# Patient Record
Sex: Female | Born: 1955 | Race: White | Hispanic: No | Marital: Married
Health system: Midwestern US, Community
[De-identification: ages and names within clinical notes are randomized; demographics above are authoritative.]

## PROBLEM LIST (undated history)

## (undated) DIAGNOSIS — S46011A Strain of muscle(s) and tendon(s) of the rotator cuff of right shoulder, initial encounter: Secondary | ICD-10-CM

---

## 2016-10-28 ENCOUNTER — Emergency Department: Admit: 2016-10-28 | Payer: MEDICARE | Primary: Internal Medicine

## 2016-10-28 ENCOUNTER — Inpatient Hospital Stay
Admit: 2016-10-28 | Discharge: 2016-10-28 | Disposition: A | Discharge status: Home / Self Care | Payer: MEDICARE | Attending: Emergency Medicine

## 2016-10-28 DIAGNOSIS — M25552 Pain in left hip: Secondary | ICD-10-CM

## 2016-10-28 LAB — METABOLIC PANEL, COMPREHENSIVE
A-G Ratio: 1 — ABNORMAL LOW (ref 1.1–2.2)
ALT (SGPT): 48 U/L (ref 12–78)
AST (SGOT): 32 U/L (ref 15–37)
Albumin: 4.3 g/dL (ref 3.5–5.0)
Alk. phosphatase: 48 U/L (ref 45–117)
Anion gap: 8 mmol/L (ref 5–15)
BUN/Creatinine ratio: 18 (ref 12–20)
BUN: 19 MG/DL (ref 6–20)
Bilirubin, total: 0.5 MG/DL (ref 0.2–1.0)
CO2: 27 mmol/L (ref 21–32)
Calcium: 9.9 MG/DL (ref 8.5–10.1)
Chloride: 103 mmol/L (ref 97–108)
Creatinine: 1.06 MG/DL — ABNORMAL HIGH (ref 0.55–1.02)
GFR est AA: >60 mL/min/{1.73_m2} (ref >60)
GFR est non-AA: 53 mL/min/{1.73_m2} — ABNORMAL LOW (ref >60)
Globulin: 4.1 g/dL — ABNORMAL HIGH (ref 2.0–4.0)
Glucose: 105 mg/dL — ABNORMAL HIGH (ref 65–100)
Potassium: 3.7 mmol/L (ref 3.5–5.1)
Protein, total: 8.4 g/dL — ABNORMAL HIGH (ref 6.4–8.2)
Sodium: 138 mmol/L (ref 136–145)

## 2016-10-28 LAB — CBC WITH AUTOMATED DIFF
ABS. BASOPHILS: 0 10*3/uL (ref 0.0–0.1)
ABS. EOSINOPHILS: 0 10*3/uL (ref 0.0–0.4)
ABS. IMM. GRANS.: 0.1 10*3/uL — ABNORMAL HIGH (ref 0.00–0.04)
ABS. LYMPHOCYTES: 1.3 10*3/uL (ref 0.8–3.5)
ABS. MONOCYTES: 0.3 10*3/uL (ref 0.0–1.0)
ABS. NEUTROPHILS: 6.6 10*3/uL (ref 1.8–8.0)
ABSOLUTE NRBC: 0 10*3/uL (ref 0.00–0.01)
BASOPHILS: 0 % (ref 0–1)
EOSINOPHILS: 0 % (ref 0–7)
HCT: 39.6 % (ref 35.0–47.0)
HGB: 13.6 g/dL (ref 11.5–16.0)
IMMATURE GRANULOCYTES: 1 % — ABNORMAL HIGH (ref 0.0–0.5)
LYMPHOCYTES: 16 % (ref 12–49)
MCH: 38.2 PG — ABNORMAL HIGH (ref 26.0–34.0)
MCHC: 34.3 g/dL (ref 30.0–36.5)
MCV: 111.2 FL — ABNORMAL HIGH (ref 80.0–99.0)
MONOCYTES: 4 % — ABNORMAL LOW (ref 5–13)
MPV: 9.1 FL (ref 8.9–12.9)
NEUTROPHILS: 79 % — ABNORMAL HIGH (ref 32–75)
NRBC: 0 PER 100 WBC
PLATELET: 304 10*3/uL (ref 150–400)
RBC: 3.56 M/uL — ABNORMAL LOW (ref 3.80–5.20)
RDW: 12.9 % (ref 11.5–14.5)
WBC: 8.3 10*3/uL (ref 3.6–11.0)

## 2016-10-28 LAB — URINALYSIS W/ REFLEX CULTURE
Bacteria: NEGATIVE /hpf
Bilirubin: NEGATIVE
Glucose: NEGATIVE mg/dL
Ketone: NEGATIVE mg/dL
Leukocyte Esterase: NEGATIVE
Nitrites: NEGATIVE
Protein: NEGATIVE mg/dL
Specific gravity: 1.008 (ref 1.003–1.030)
Urobilinogen: 0.2 EU/dL (ref 0.2–1.0)
pH (UA): 7.5 (ref 5.0–8.0)

## 2016-10-28 LAB — PTT: aPTT: 30.7 s (ref 22.1–32.0)

## 2016-10-28 LAB — PROTHROMBIN TIME + INR
INR: 1 (ref 0.9–1.1)
Prothrombin time: 10.1 s (ref 9.0–11.1)

## 2016-10-28 MED ORDER — SODIUM CHLORIDE 0.9 % IV
INTRAVENOUS | Status: DC
Start: 2016-10-28 — End: 2016-10-28
  Administered 2016-10-28: 19:00:00 via INTRAVENOUS

## 2016-10-28 MED ORDER — DIAZEPAM 5 MG TAB
5 mg | ORAL | Status: AC
Start: 2016-10-28 — End: 2016-10-28
  Administered 2016-10-28: 23:00:00 via ORAL

## 2016-10-28 MED ORDER — FENTANYL CITRATE (PF) 50 MCG/ML IJ SOLN
50 mcg/mL | INTRAMUSCULAR | Status: AC
Start: 2016-10-28 — End: 2016-10-28
  Administered 2016-10-28: 19:00:00 via INTRAVENOUS

## 2016-10-28 MED ORDER — OXYCODONE 5 MG TAB
5 mg | ORAL | Status: AC
Start: 2016-10-28 — End: 2016-10-28
  Administered 2016-10-28: 21:00:00 via ORAL

## 2016-10-28 MED ORDER — DIAZEPAM 5 MG TAB
5 mg | ORAL_TABLET | Freq: Three times a day (TID) | ORAL | 0 refills | Status: AC | PRN
Start: 2016-10-28 — End: ?

## 2016-10-28 MED ORDER — METHYLPREDNISOLONE (PF) 125 MG/2 ML IJ SOLR
125 mg/2 mL | INTRAMUSCULAR | Status: AC
Start: 2016-10-28 — End: 2016-10-28
  Administered 2016-10-28: 23:00:00 via INTRAVENOUS

## 2016-10-28 MED ORDER — PREDNISONE 20 MG TAB
20 mg | ORAL_TABLET | Freq: Every day | ORAL | 0 refills | Status: AC
Start: 2016-10-28 — End: 2016-11-02

## 2016-10-28 MED FILL — DIAZEPAM 5 MG TAB: 5 mg | ORAL | Qty: 2

## 2016-10-28 MED FILL — FENTANYL CITRATE (PF) 50 MCG/ML IJ SOLN: 50 mcg/mL | INTRAMUSCULAR | Qty: 2

## 2016-10-28 MED FILL — SOLU-MEDROL (PF) 125 MG/2 ML SOLUTION FOR INJECTION: 125 mg/2 mL | INTRAMUSCULAR | Qty: 2

## 2016-10-28 MED FILL — OXYCODONE 5 MG TAB: 5 mg | ORAL | Qty: 2

## 2016-10-28 NOTE — ED Notes (Signed)
Dr. Kizzie Banehughes reviewed discharge instructions with the patient and spouse.  The patient and spouse verbalized understanding. Pt discharged home via wheelchair, discharge papers in hand.

## 2016-10-28 NOTE — ED Notes (Signed)
Pt to xray via stretcher.

## 2016-10-28 NOTE — ED Provider Notes (Signed)
EMERGENCY DEPARTMENT HISTORY AND PHYSICAL EXAM      Date: 10/28/2016  Patient Name: Sharon Camacho    History of Presenting Illness     Chief Complaint   Patient presents with   ??? Hip Pain     left hip pain s/p fall     History Provided By: Patient    HPI: Sharon Camacho, 61 y.o. female with PMHx significant for rheumatoid arthritis, multiple sclerosis, HTN, presents via EMS to the ED with cc of new onset moderate L hip pain following a GLF, ongoing for several minutes. Pt reports she slipped on wet grass and landed on her L side. She denies any head trauma or LOC. Pt denies taking any OTC medications for relief. She notes exacerbation of L hip with movement or ambulation. Pt specifically denies any HA, SOB, CP, nausea, vomiting, abdominal pain, fever, chills, or diarrhea.    There are no other complaints, changes, or physical findings at this time.    PCP: Other, Phys, MD  SHx: (-) smoker, (+) EtOH use: occasional, (-)illicit drug use  Current Facility-Administered Medications   Medication Dose Route Frequency Provider Last Rate Last Dose   ??? 0.9% sodium chloride infusion  100 mL/hr IntraVENous CONTINUOUS Gearldine Bienenstock, DO 100 mL/hr at 10/28/16 1443 100 mL/hr at 10/28/16 1443     Current Outpatient Medications   Medication Sig Dispense Refill   ??? amLODIPine (NORVASC) 5 mg tablet Take 5 mg by mouth daily.     ??? azaTHIOprine (IMURAN) 50 mg tablet Take 50 mg by mouth two (2) times a day.     ??? predniSONE (DELTASONE) 5 mg tablet Take  by mouth daily.     ??? methotrexate (TREXALL) 10 mg tablet Take 10 mg by mouth every Wednesday.     ??? cyclobenzaprine (FLEXERIL) 10 mg tablet Take  by mouth three (3) times daily as needed for Muscle Spasm(s).     ??? traMADol (ULTRAM) 50 mg tablet Take 50 mg by mouth every six (6) hours as needed for Pain.     ??? levothyroxine (SYNTHROID) 112 mcg tablet Take  by mouth Daily (before breakfast).     ??? raNITIdine (ZANTAC 75) 75 mg tab Take  by mouth nightly.      ??? diazePAM (VALIUM) 5 mg tablet Take 1 Tab by mouth every eight (8) hours as needed (spasm). Max Daily Amount: 15 mg. 15 Tab 0   ??? predniSONE (DELTASONE) 20 mg tablet Take 2 Tabs by mouth daily for 5 days. With Breakfast 10 Tab 0       Past History     Past Medical History:  Past Medical History:   Diagnosis Date   ??? Hypertension    ??? Multiple sclerosis ([place])    ??? Raynaud disease    ??? Rheumatoid arthritis ([place])    ??? Ulcerative colitis ([place])        Past Surgical History:  Past Surgical History:   Procedure Laterality Date   ??? HX ACL RECONSTRUCTION Left    ??? HX CESAREAN SECTION     ??? HX HYSTERECTOMY     ??? HX ORTHOPAEDIC     ??? HX ROTATOR CUFF REPAIR Left    ??? HX THYROIDECTOMY         Family History:  History reviewed. No pertinent family history.    Social History:  Social History     Tobacco Use   ??? Smoking status: Never Smoker   ??? Smokeless tobacco: Never Used   Substance  Use Topics   ??? Alcohol use: Yes     Frequency: Never     Comment: rarely   ??? Drug use: No       Allergies:  Allergies   Allergen Reactions   ??? Sulfa (Sulfonamide Antibiotics) Anaphylaxis     Review of Systems   Review of Systems   Constitutional: Negative.  Negative for appetite change, chills, fatigue and fever.   HENT: Negative.  Negative for congestion, rhinorrhea, sinus pressure and sore throat.    Eyes: Negative.    Respiratory: Negative.  Negative for cough, choking, chest tightness, shortness of breath and wheezing.    Cardiovascular: Negative.  Negative for chest pain, palpitations and leg swelling.   Gastrointestinal: Negative for abdominal pain, constipation, diarrhea, nausea and vomiting.   Endocrine: Negative.    Genitourinary: Negative.  Negative for difficulty urinating, dysuria, flank pain and urgency.   Musculoskeletal: Positive for arthralgias (L hip).   Skin: Negative.    Neurological: Negative.  Negative for dizziness, speech difficulty, weakness, light-headedness, numbness and headaches.   Psychiatric/Behavioral: Negative.     All other systems reviewed and are negative.    Physical Exam   Physical Exam   Constitutional: She is oriented to person, place, and time. She appears well-developed and well-nourished. No distress.   HENT:   Head: Normocephalic and atraumatic.   Mouth/Throat: Oropharynx is clear and moist. No oropharyngeal exudate.   Eyes: Conjunctivae and EOM are normal. Pupils are equal, round, and reactive to light.   Neck: Normal range of motion. Neck supple. No JVD present. No tracheal deviation present.   Cardiovascular: Normal rate, regular rhythm, normal heart sounds and intact distal pulses.   No murmur heard.  Pulmonary/Chest: Effort normal and breath sounds normal. No stridor. No respiratory distress. She has no wheezes. She has no rales. She exhibits no tenderness.   Abdominal: Soft. She exhibits no distension. There is no tenderness. There is no rebound and no guarding.   Musculoskeletal: Normal range of motion. She exhibits tenderness (Left hip). She exhibits no edema.   No C/T/L spine tenderness, there is no tenderness below left hip, bil UE non-tender, distal PMS intact.    Neurological: She is alert and oriented to person, place, and time. No cranial nerve deficit.   No gross motor or sensory deficits    Skin: Skin is warm and dry. She is not diaphoretic.   Psychiatric: She has a normal mood and affect. Her behavior is normal.   Nursing note and vitals reviewed.    Diagnostic Study Results     Labs -     Recent Results (from the past 12 hour(s))   CBC WITH AUTOMATED DIFF    Collection Time: 10/28/16  2:34 PM   Result Value Ref Range    WBC 8.3 3.6 - 11.0 K/uL    RBC 3.56 (L) 3.80 - 5.20 M/uL    HGB 13.6 11.5 - 16.0 g/dL    HCT 39.6 35.0 - 47.0 %    MCV 111.2 (H) 80.0 - 99.0 FL    MCH 38.2 (H) 26.0 - 34.0 PG    MCHC 34.3 30.0 - 36.5 g/dL    RDW 12.9 11.5 - 14.5 %    PLATELET 304 150 - 400 K/uL    MPV 9.1 8.9 - 12.9 FL    NRBC 0.0 0 PER 100 WBC    ABSOLUTE NRBC 0.00 0.00 - 0.01 K/uL    NEUTROPHILS 79 (H) 32 - 75 %  LYMPHOCYTES 16 12 - 49 %    MONOCYTES 4 (L) 5 - 13 %    EOSINOPHILS 0 0 - 7 %    BASOPHILS 0 0 - 1 %    IMMATURE GRANULOCYTES 1 (H) 0.0 - 0.5 %    ABS. NEUTROPHILS 6.6 1.8 - 8.0 K/UL    ABS. LYMPHOCYTES 1.3 0.8 - 3.5 K/UL    ABS. MONOCYTES 0.3 0.0 - 1.0 K/UL    ABS. EOSINOPHILS 0.0 0.0 - 0.4 K/UL    ABS. BASOPHILS 0.0 0.0 - 0.1 K/UL    ABS. IMM. GRANS. 0.1 (H) 0.00 - 0.04 K/UL    DF SMEAR SCANNED      RBC COMMENTS MACROCYTOSIS     METABOLIC PANEL, COMPREHENSIVE    Collection Time: 10/28/16  2:34 PM   Result Value Ref Range    Sodium 138 136 - 145 mmol/L    Potassium 3.7 3.5 - 5.1 mmol/L    Chloride 103 97 - 108 mmol/L    CO2 27 21 - 32 mmol/L    Anion gap 8 5 - 15 mmol/L    Glucose 105 (H) 65 - 100 mg/dL    BUN 19 6 - 20 MG/DL    Creatinine 1.06 (H) 0.55 - 1.02 MG/DL    BUN/Creatinine ratio 18 12 - 20      GFR est AA >60 >60 ml/min/1.90m    GFR est non-AA 53 (L) >60 ml/min/1.769m   Calcium 9.9 8.5 - 10.1 MG/DL    Bilirubin, total 0.5 0.2 - 1.0 MG/DL    ALT (SGPT) 48 12 - 78 U/L    AST (SGOT) 32 15 - 37 U/L    Alk. phosphatase 48 45 - 117 U/L    Protein, total 8.4 (H) 6.4 - 8.2 g/dL    Albumin 4.3 3.5 - 5.0 g/dL    Globulin 4.1 (H) 2.0 - 4.0 g/dL    A-G Ratio 1.0 (L) 1.1 - 2.2     PROTHROMBIN TIME + INR    Collection Time: 10/28/16  2:34 PM   Result Value Ref Range    INR 1.0 0.9 - 1.1      Prothrombin time 10.1 9.0 - 11.1 sec   PTT    Collection Time: 10/28/16  2:34 PM   Result Value Ref Range    aPTT 30.7 22.1 - 32.0 sec    aPTT, therapeutic range     58.0 - 77.0 SECS   URINALYSIS W/ REFLEX CULTURE    Collection Time: 10/28/16  2:34 PM   Result Value Ref Range    Color YELLOW/STRAW      Appearance CLEAR CLEAR      Specific gravity 1.008 1.003 - 1.030      pH (UA) 7.5 5.0 - 8.0      Protein NEGATIVE  NEG mg/dL    Glucose NEGATIVE  NEG mg/dL    Ketone NEGATIVE  NEG mg/dL    Bilirubin NEGATIVE  NEG      Blood TRACE (A) NEG      Urobilinogen 0.2 0.2 - 1.0 EU/dL    Nitrites NEGATIVE  NEG       Leukocyte Esterase NEGATIVE  NEG      WBC 0-4 0 - 4 /hpf    RBC 0-5 0 - 5 /hpf    Epithelial cells FEW FEW /lpf    Bacteria NEGATIVE  NEG /hpf    UA:UC IF INDICATED CULTURE NOT INDICATED BY UA RESULT CNI  Hyaline cast 0-2 0 - 5 /lpf       Radiologic Studies -   XR HIP LT W OR WO PELV 2-3 VWS   Final Result   Initial Result:   Impression:    IMPRESSION: ??No acute abnormality.                Narrative:    EXAM: ??XR HIP LT W OR WO PELV 2-3 VWS      HISTORY: Trauma  INDICATION: ?? Trauma, fall, shortened, externally rotated.    COMPARISON: None.    FINDINGS: An AP view of the pelvis and a frogleg lateral view of the left hip  demonstrate no fracture, dislocation or other acute abnormality.                 CXR Results  (Last 48 hours)               10/28/16 1456  XR CHEST SNGL V Final result    Impression:  IMPRESSION:       No acute process.           Narrative:  EXAM:  XR CHEST SNGL V       INDICATION:  Preoperative evaluation. Rheumatoid arthritis, multiple sclerosis,   and ulcerative colitis. Hypertension.       COMPARISON: None       TECHNIQUE: Frontal chest view       FINDINGS: The cardiomediastinal and hilar contours are within normal limits. The   pulmonary vasculature is within normal limits.        The lungs and pleural spaces are clear. There is no pneumothorax. The visualized   bones and upper abdomen are age-appropriate.               Medical Decision Making   I am the first provider for this patient.    I reviewed the vital signs, available nursing notes, past medical history, past surgical history, family history and social history.    Vital Signs-Reviewed the patient's vital signs.  Patient Vitals for the past 12 hrs:   Temp BP SpO2   10/28/16 1630 ??? 165/80 99 %   10/28/16 1615 ??? 154/74 100 %   10/28/16 1545 ??? 107/83 99 %   10/28/16 1533 ??? (!) 136/108 97 %   10/28/16 1515 ??? 161/86 97 %   10/28/16 1500 ??? 172/82 98 %   10/28/16 1415 ??? 174/83 100 %   10/28/16 1400 ??? 157/85 98 %    10/28/16 1349 98.6 ??F (37 ??C) 159/82 ???     Pulse Oximetry Analysis - 98% on RA    Records Reviewed: Nursing Notes, Old Medical Records and Ambulance Run Sheet    Provider Notes (Medical Decision Making):   DDx: hip fracture, hip dislocation, pelvic fracture    ED Course:   Initial assessment performed. The patients presenting problems have been discussed, and they are in agreement with the care plan formulated and outlined with them.  I have encouraged them to ask questions as they arise throughout their visit.     After x-ray identified no fracture, an attempt to ambulate pt was made. Pt was unable to walk secondary to pain, CT pelvis ordered.     Critical Care Time: 0    Disposition:  Discharge Note:  7:42 PM  CT demonstrates Lumbar disc disease which is known to the patient, however anatomically issues should affect right side. 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   The pt is ready for discharge. The pt's signs, symptoms, diagnosis, and discharge instructions have been discussed and pt has conveyed their understanding. The pt is to follow up as recommended or return to ER should their symptoms worsen. Plan has been discussed and pt is in agreement.    PLAN:  1.   Current Discharge Medication List      START taking these medications    Details   diazePAM (VALIUM) 5 mg tablet Take 1 Tab by mouth every eight (8) hours as needed (spasm). Max Daily Amount: 15 mg.  Qty: 15 Tab, Refills: 0    Associated Diagnoses: Hip pain, acute, left; Fall, initial encounter      !! predniSONE (DELTASONE) 20 mg tablet Take 2 Tabs by mouth daily for 5 days. With Breakfast  Qty: 10 Tab, Refills: 0       !! - Potential duplicate medications found. Please discuss with provider.      CONTINUE these medications which have NOT CHANGED    Details   !! predniSONE (DELTASONE) 5 mg tablet Take  by mouth daily.        !! - Potential duplicate medications found. Please discuss with provider.        2.   Follow-up Information     Follow up With Specialties Details Why Contact Info    Other, Phys, MD    Patient can only remember the practice name and not the physician          Return to ED if worse   Diagnosis     Clinical Impression:   1. Hip pain, acute, left    2. Fall, initial encounter        Attestations:  This note is prepared by Evelena Asa, acting as a Education administrator for Gearldine Bienenstock, [place].    Gearldine Bienenstock, DO: The scribe's documentation has been prepared under my direction and personally reviewed by me in its entirety. I confirm that the notes above accurately reflects all work, treatment, procedures, and medical decision making performed by me.    This note will not be viewable in [facility].

## 2016-10-28 NOTE — ED Notes (Signed)
Attempted to ambulate patient, pt unable to lift left leg off of bed. Pt states the pain is now in the back of her leg near her buttock. Dr. Kizzie BaneHughes notified.

## 2016-10-28 NOTE — ED Notes (Signed)
Pt requesting CT on disc. CT called.

## 2020-09-14 ENCOUNTER — Encounter

## 2020-09-28 ENCOUNTER — Inpatient Hospital Stay: Admit: 2020-09-28 | Payer: MEDICARE | Primary: Internal Medicine

## 2020-09-28 DIAGNOSIS — S46011A Strain of muscle(s) and tendon(s) of the rotator cuff of right shoulder, initial encounter: Secondary | ICD-10-CM

## 2021-08-26 IMAGING — CT CT RIGHT SHOULDER WITHOUT CONTRAST
3 series · 15 of 27 positions shown, 18 images · non-contrast
Comparison: 08/23/2021 [facility] radiographs

________________________________________________________________________________________________ 
CT RIGHT SHOULDER WITHOUT CONTRAST, 08/26/2021 [DATE]: 
CLINICAL INDICATION: Sprain of right rotator cuff capsule, subsequent encounter. 
A search for DICOM formatted images was conducted for prior CT imaging studies 
completed at a non-affiliated media free facility.
TECHNIQUE: The right shoulder was scanned without contrast on a high resolution 
low dose CT scanner. Routine MPR reconstructions were performed.

[Series 3: axial · axial · 0.49mm/px · z∈[-133,-9]mm · 5 of 187 slices shown, 7 images]
[im 32/187  soft-tissue]
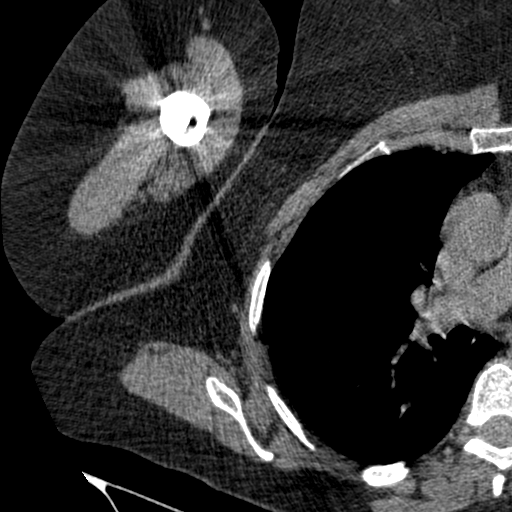
[im 32/187  bone]
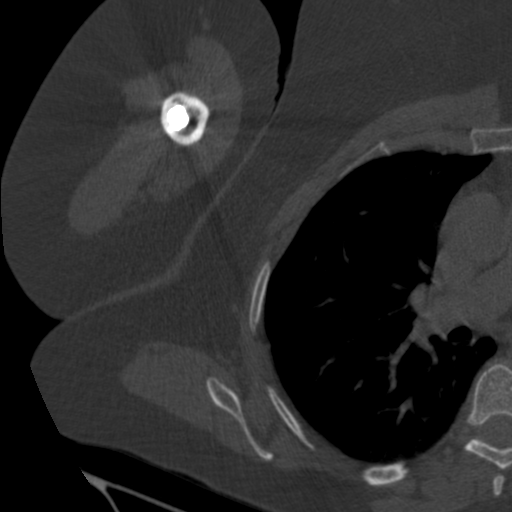
[im 63/187  bone]
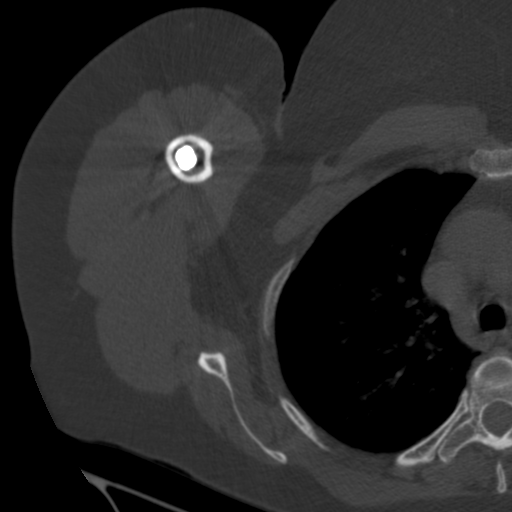
[im 94/187  bone]
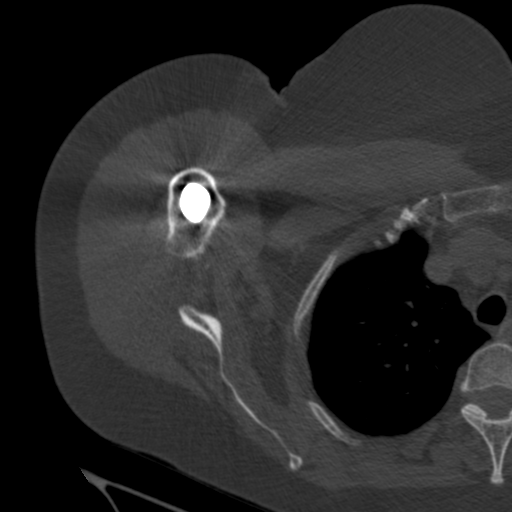
[im 125/187  bone]
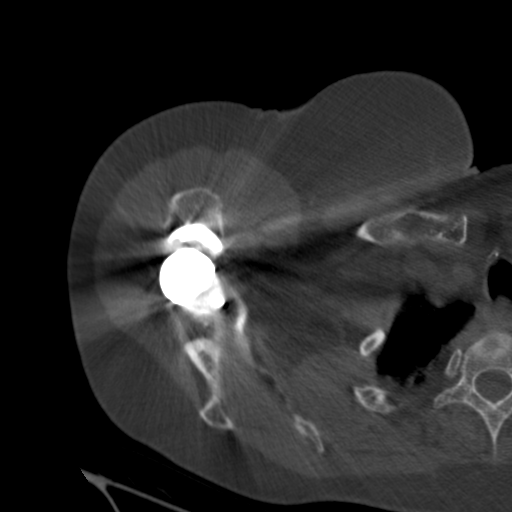
[im 156/187  soft-tissue]
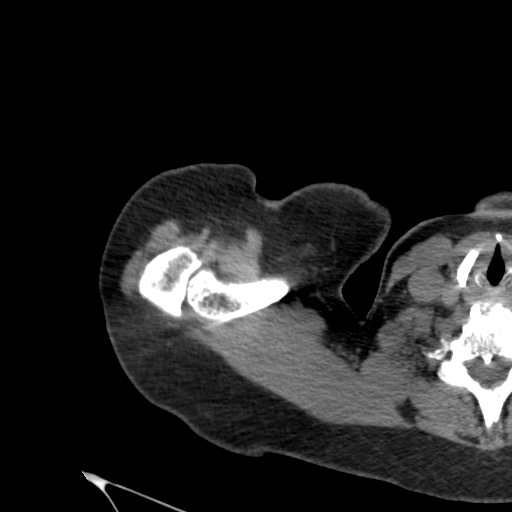
[im 156/187  bone]
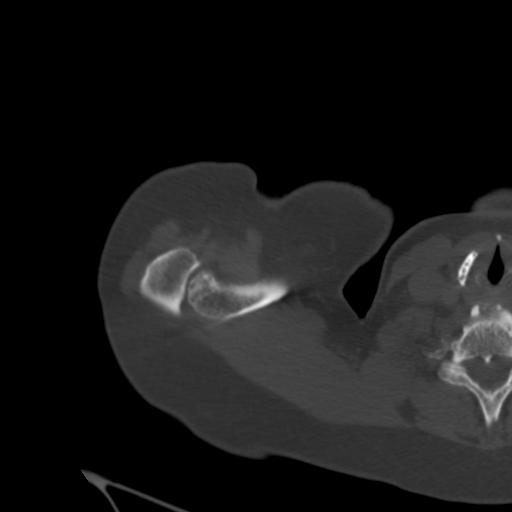

[Series 5: axial (person_name) · axial · 0.49mm/px · z∈[-133,-9]mm · 5 of 187 slices shown]
[im 32/187  bone]
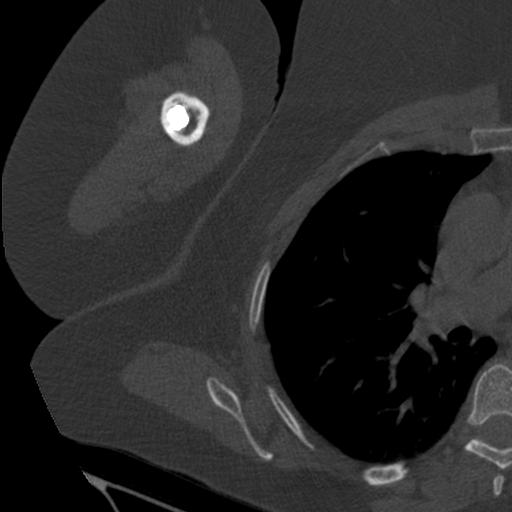
[im 63/187  bone]
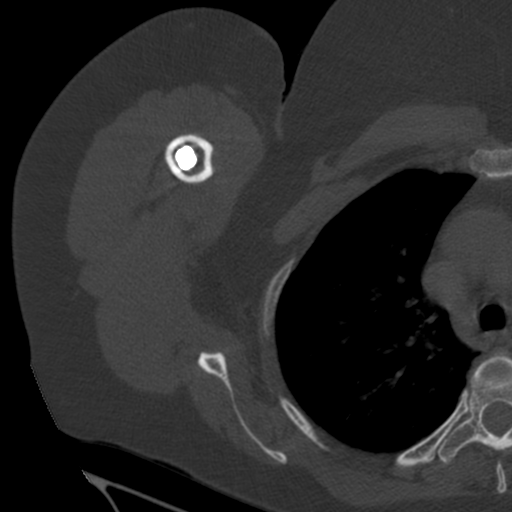
[im 94/187  bone]
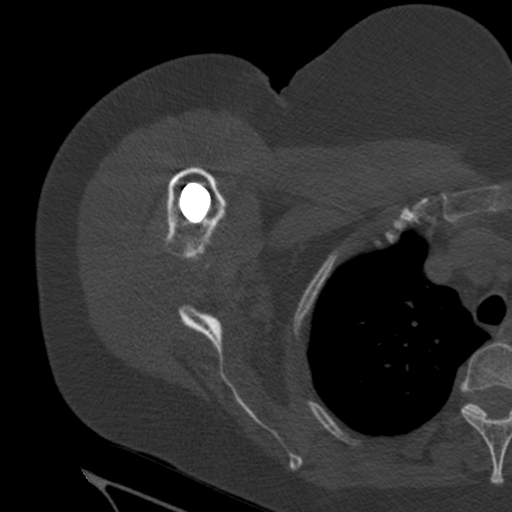
[im 125/187  bone]
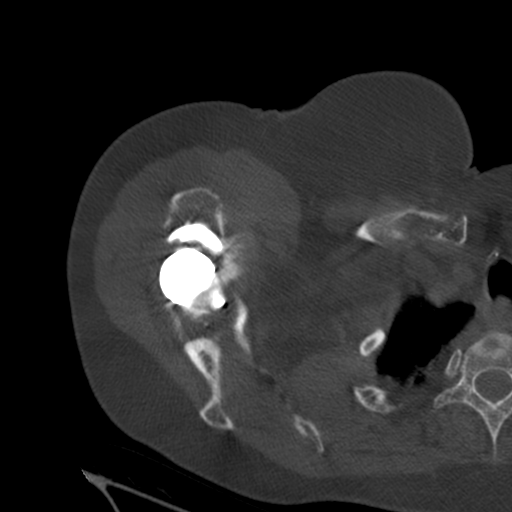
[im 156/187  bone]
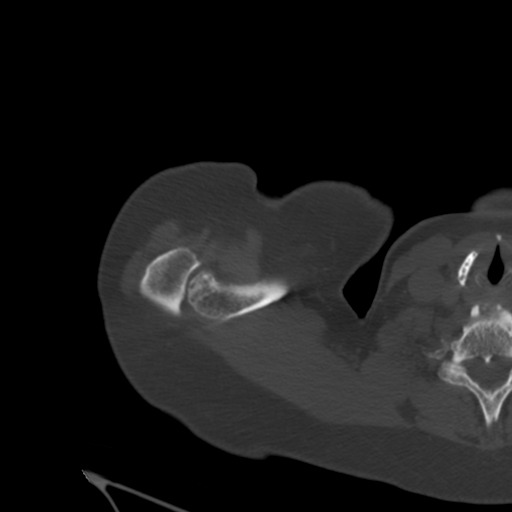

[Series 602: cor · sagittal · 0.49mm/px · 5 of 105 slices shown, 6 images]
[im 35/105  bone]
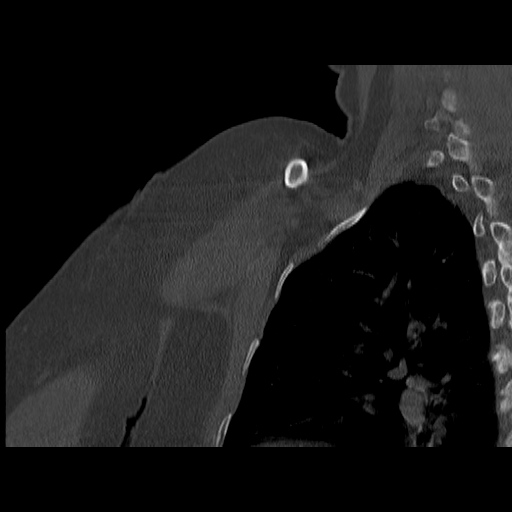
[im 44/105  bone]
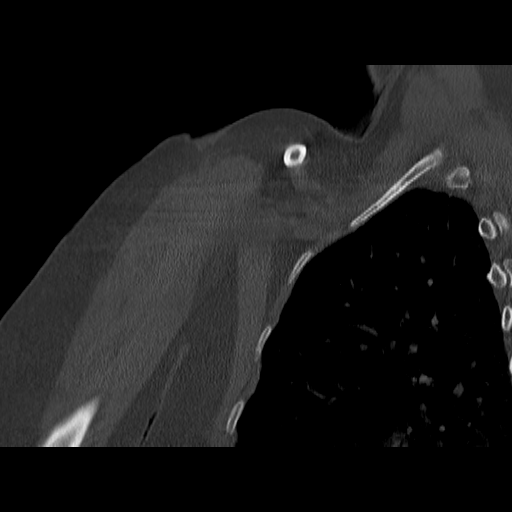
[im 53/105  soft-tissue]
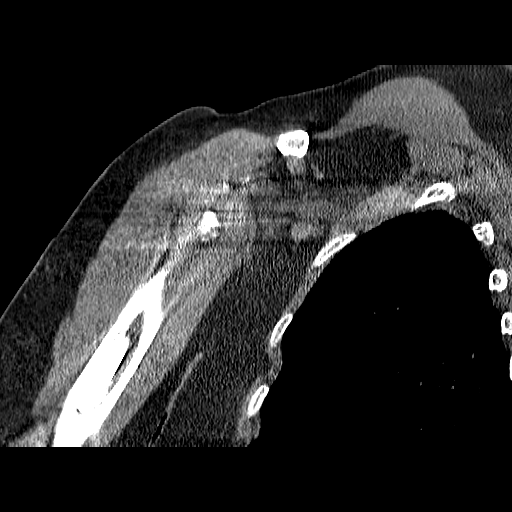
[im 53/105  bone]
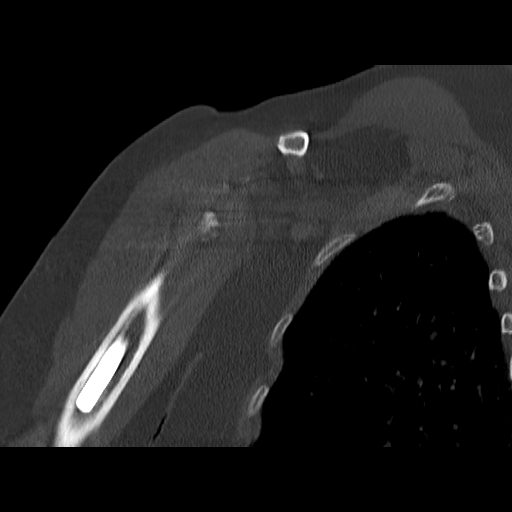
[im 61/105  bone]
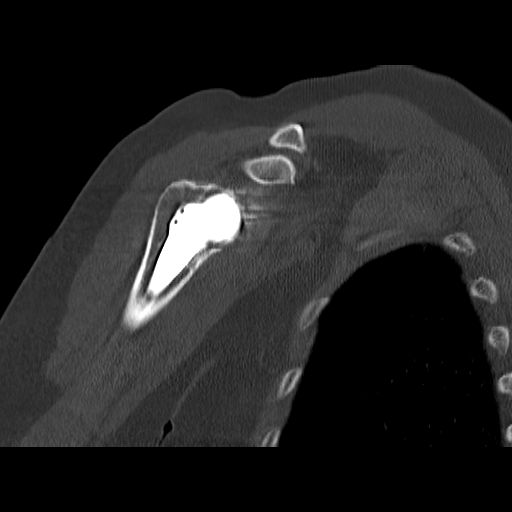
[im 70/105  bone]
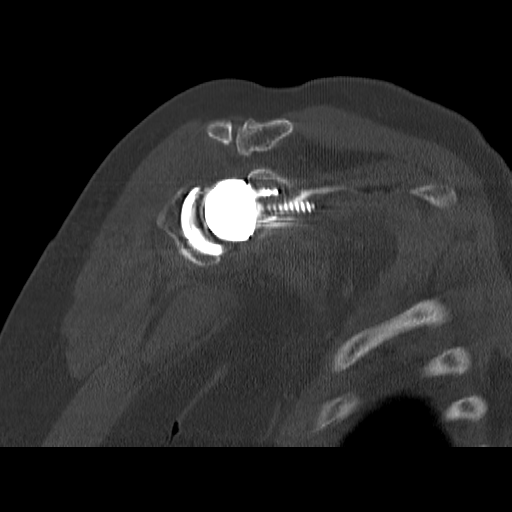

[15 of 27 positions shown; findings below may reference images not displayed]

FINDINGS: TSA/HUMERUS/SCAPULA: Noncemented reverse delta total shoulder arthroplasty (TSA) 
is well seated. No periprosthetic fracture, osteolysis or evidence of loosening. 
No inferior glenoid notching. No joint effusion. Osteopenia. 
ACROMIOCLAVICULAR JOINT/CLAVICLE: Mild AC joint degenerative change with 7 mm 
distal acromial subcortical cyst. No mass effect upon the supraspinatus.  
STERNOCLAVICULAR JOINT: Preserved. 
SPINE: Degenerative change. 
SOFT TISSUES: Moderate supraspinatus and mild subscapularis fatty muscular 
atrophy, highly worrisome for full-thickness rotator cuff tear. 4.6 x 2.5 x
cm fluid focus in the lateral aspect of the subacromial/subdeltoid bursa. 
Paratracheal clips. Atherosclerosis and alignment coronary artery 
calcifications. Subcutaneous tissues are negative.
IMPRESSION: 1.  Moderate supraspinatus and mild subscapularis fatty muscular atrophy and 
subacromial/subdeltoid bursal fluid, highly worrisome for full-thickness rotator 
cuff tear: CT arthrography may be helpful for further evaluation, if clinically 
indicated.  
2.  Noncemented reverse delta TSA is well seated.  
3.  Paratracheal clips. Atherosclerosis and alignment coronary artery 
calcifications.  
RADIATION DOSE REDUCTION: All CT scans are performed using radiation dose 
reduction techniques, when applicable.  Technical factors are evaluated and 
adjusted to ensure appropriate moderation of exposure.  Automated dose 
management technology is applied to adjust the radiation doses to minimize 
exposure while achieving diagnostic quality images.

## 2022-12-11 IMAGING — MR MRI LEFT HIP WITHOUT CONTRAST
4 of 7 series · 16 of 40 positions shown · IV contrast (gadolinium)
Comparison: 10/26/2022 [facility] radiographs

________________________________________________________________________________________________ 
MRI RIGHT HIP WITHOUT CONTRAST, MRI LEFT HIP WITHOUT CONTRAST, 12/11/2022 [DATE]: 
CLINICAL INDICATION: Pain in hips
TECHNIQUE: Multiplanar, multiecho position MR images of the pelvis and bilateral 
hips were performed without intravenous gadolinium enhancement. Small 
field-of-view imaging was performed of the hip.

[Series 101: survey_fullfov_transversal · axial · 10.0mm · 1.66mm/px · z∈[-51,+51]mm · 2 of 7 slices shown]
[im 1/7]
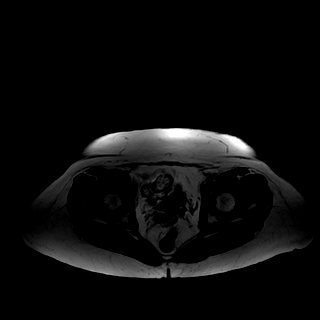
[im 7/7]
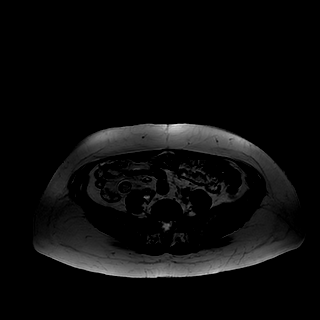

[Series 201: survey · axial · 15.0mm · 1.76mm/px · z∈[+20,+263]mm · 3 of 14 slices shown]
[im 1/14]
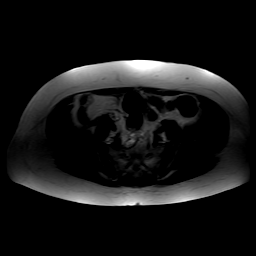
[im 7/14]
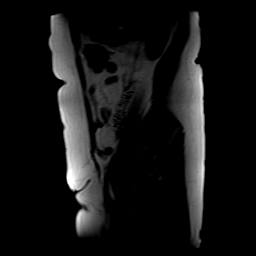
[im 14/14]
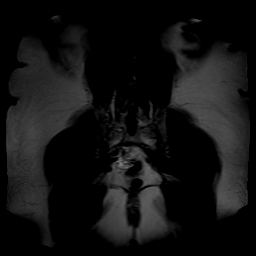

[Series 301: stir_cor-pelvis · coronal · 5.0mm · 0.70mm/px · 8 of 34 slices shown]
[im 1/34]
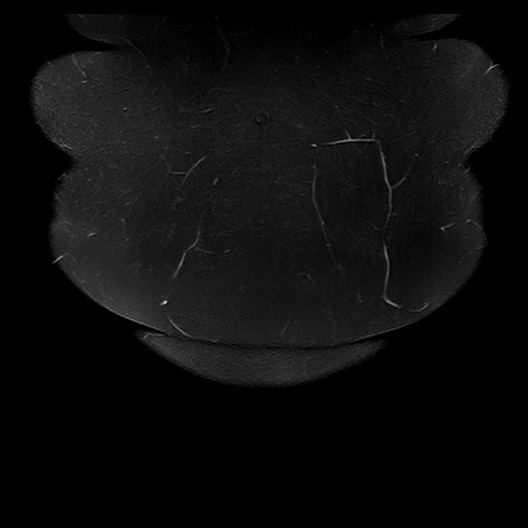
[im 5/34]
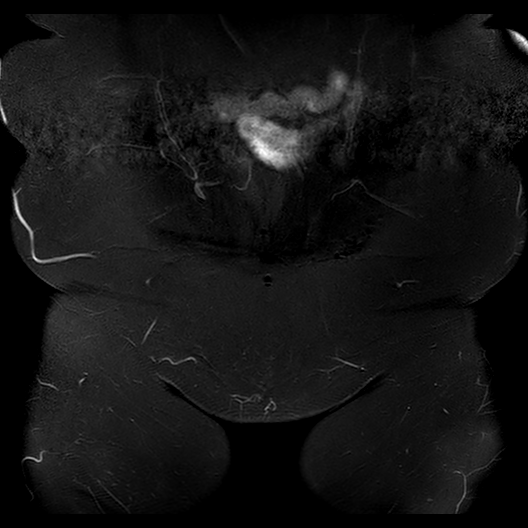
[im 10/34]
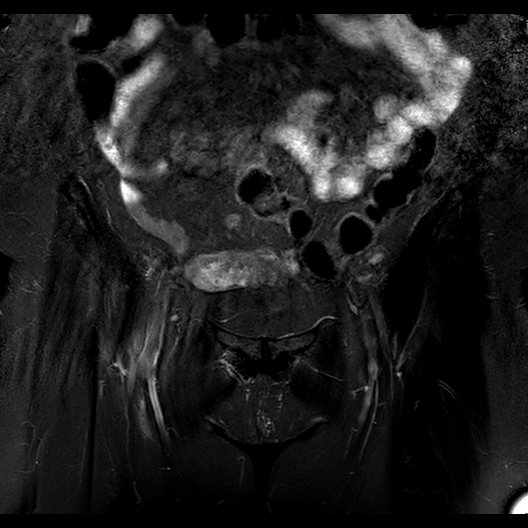
[im 15/34]
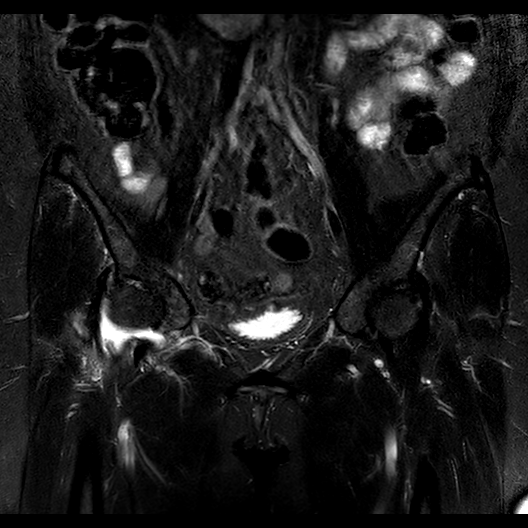
[im 19/34]
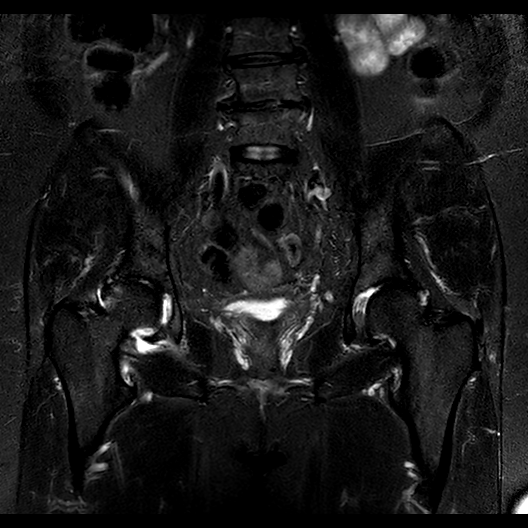
[im 24/34]
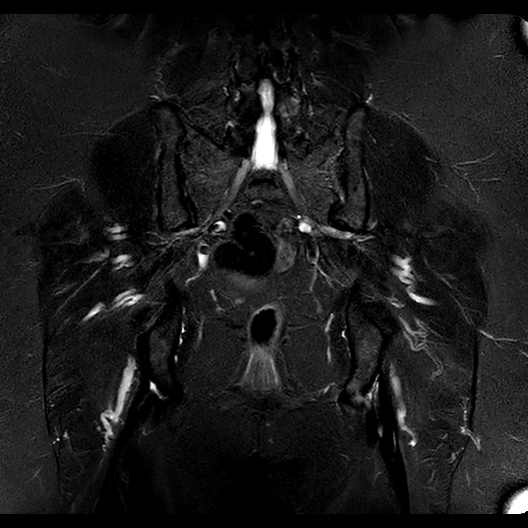
[im 29/34]
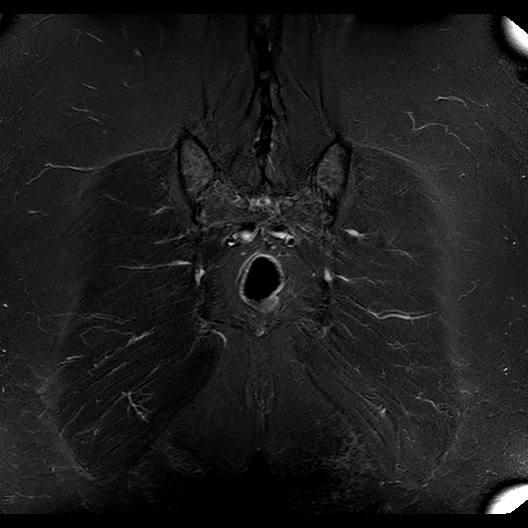
[im 34/34]
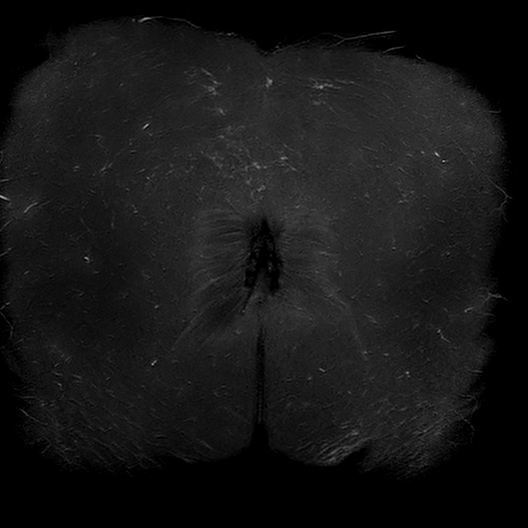

[Series 401: t1_(person_name) · axial · 5.0mm · 0.41mm/px · z∈[-140,+64]mm · 3 of 44 slices shown]
[im 5/44]
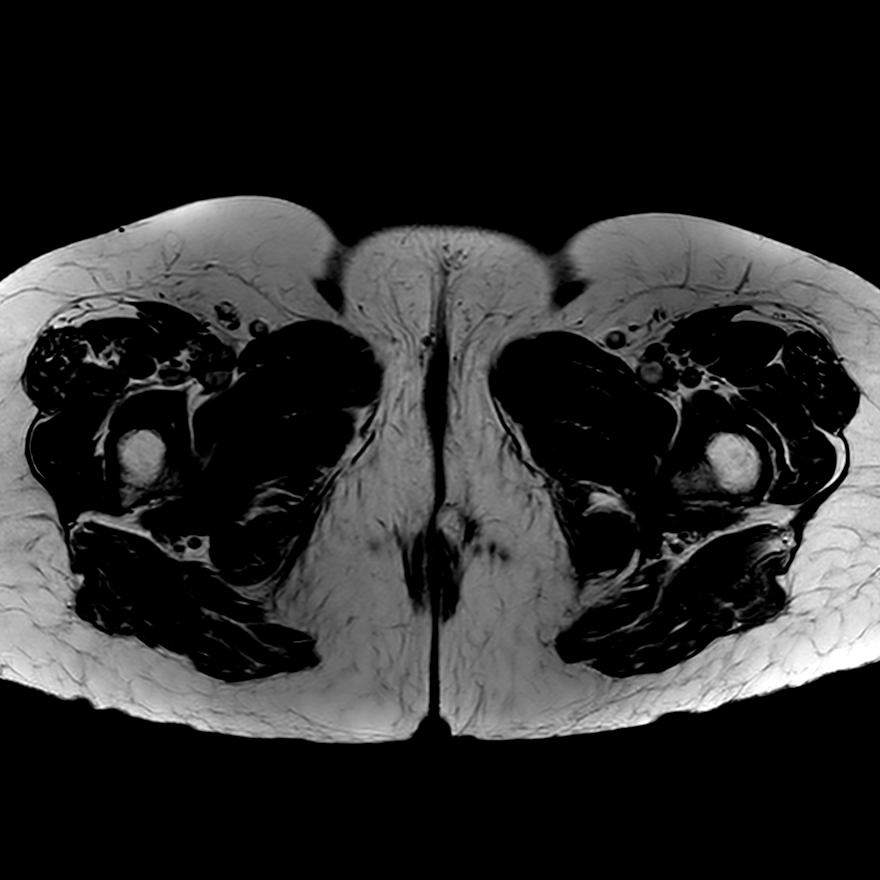
[im 24/44]
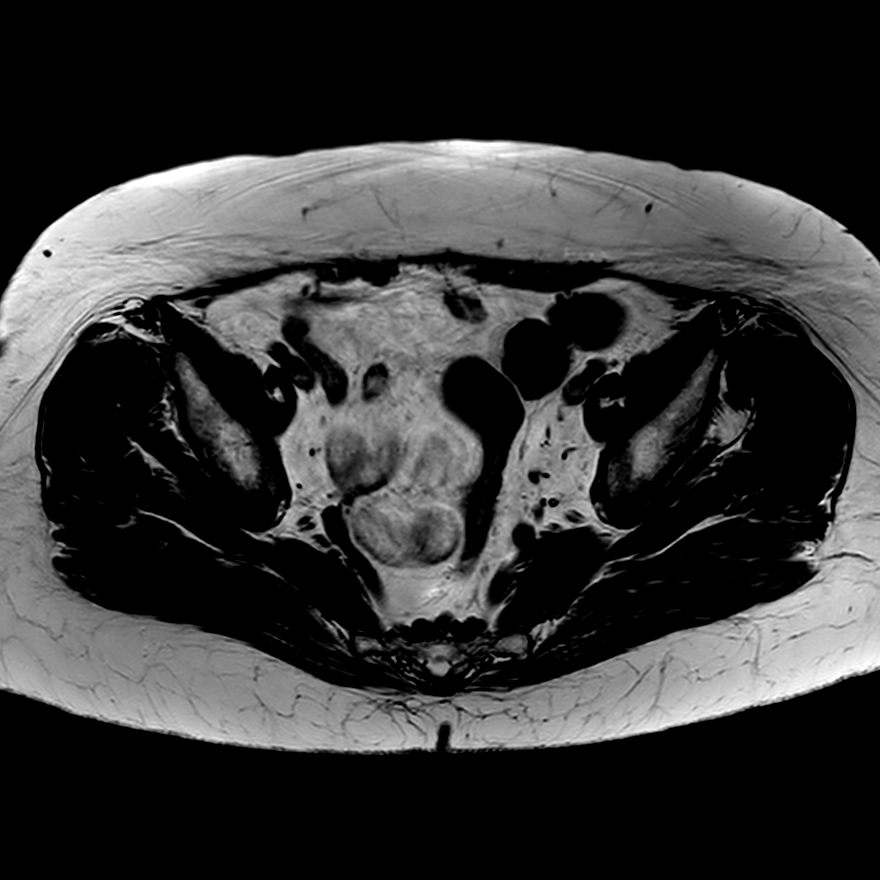
[im 39/44]
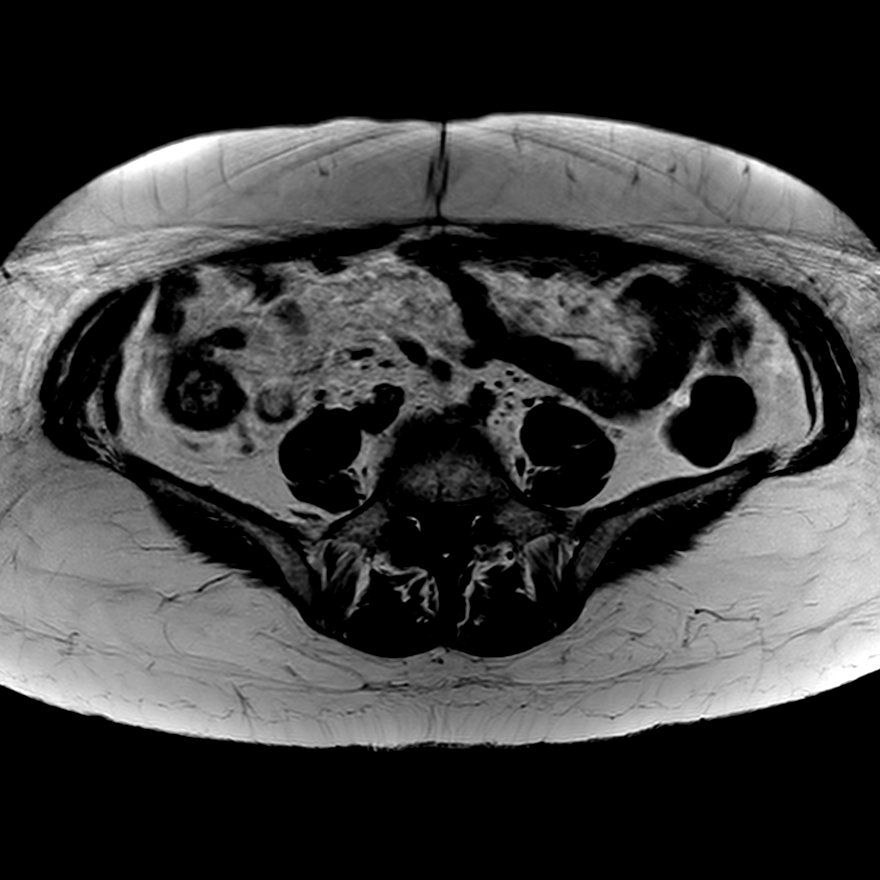

[16 of 40 positions shown; findings below may reference images not displayed]

FINDINGS: RIGHT HIP: Marked right hip degenerative change with full-thickness 
chondromalacia, small osteophytes, degenerative labral tears and 2.7 cm anterior 
paralabral cyst. Moderate joint effusion, synovitis and 1.0 cm anterior 
intra-articular body versus focal synovitis. Femoral head maintains a spherical 
configuration without evidence of avascular necrosis or subarticular collapse. 
No abnormal morphology of the proximal femur or acetabulum to predispose to 
impingement. 
LEFT HIP: Mild left hip degenerative change with partial-thickness 
chondromalacia, small osteophytes, degenerative labral tears and 0.6 cm lateral 
paralabral cyst. No hip joint effusion. Femoral head maintains a spherical 
configuration without evidence of avascular necrosis or subarticular collapse. 
No abnormal morphology of the proximal femur or acetabulum to predispose to 
impingement. 
PELVIC BONES: Normal marrow signal intensity. No fracture, contusion or marrow 
replacing lesion.  
SI JOINTS/PUBIC SYMPHYSIS: Mild degenerative change. 
SPINE: Multilevel degenerative change of the spine. 
SOFT TISSUES: Bilateral gluteus medius/minimus tendinosis with small amount of 
left peritendinous/peritrochanteric fluid. Partial thickness tears of the 
hamstring tendon origins. The rectus abdominis-adductor aponeurotic complexes 
are intact. No mass, free fluid or adenopathy. Hysterectomy and postsurgical 
change of the anterior pelvic wall. The bowel and bladder are unremarkable.
IMPRESSION: 1.  Marked right and mild left hip degenerative change, labral tears, paralabral 
cysts and moderate right joint effusions/synovitis. 
2.  Bilateral gluteus tendinosis with small amount of left 
peritendinous/peritrochanteric fluid.  
3.  Partial thickness tears of the hamstring tendon origins.  
4.  Mild degenerative change of the pubic symphysis/SI joints and multilevel 
degenerative change of the spine.  
5.  Hysterectomy.

## 2022-12-11 IMAGING — MR MRI RIGHT HIP WITHOUT CONTRAST
4 of 7 series · 16 of 40 positions shown · IV contrast (gadolinium)
Comparison: 10/26/2022 [facility] radiographs

________________________________________________________________________________________________ 
MRI RIGHT HIP WITHOUT CONTRAST, MRI LEFT HIP WITHOUT CONTRAST, 12/11/2022 [DATE]: 
CLINICAL INDICATION: Pain in hips
TECHNIQUE: Multiplanar, multiecho position MR images of the pelvis and bilateral 
hips were performed without intravenous gadolinium enhancement. Small 
field-of-view imaging was performed of the hip.

[Series 101: survey_fullfov_transversal · axial · 10.0mm · 1.66mm/px · z∈[-51,+51]mm · 2 of 7 slices shown]
[im 1/7]
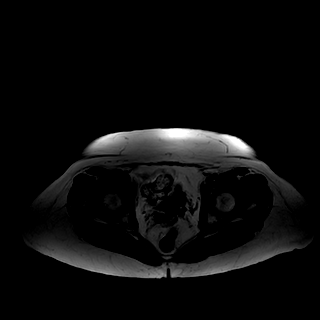
[im 7/7]
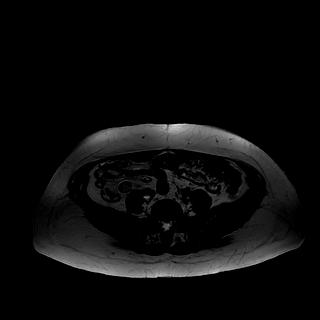

[Series 201: survey · axial · 15.0mm · 1.76mm/px · z∈[+20,+263]mm · 3 of 14 slices shown]
[im 1/14]
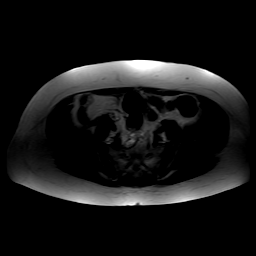
[im 7/14]
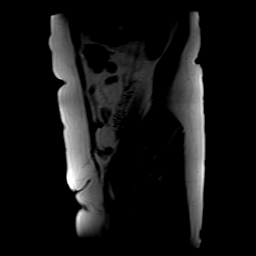
[im 14/14]
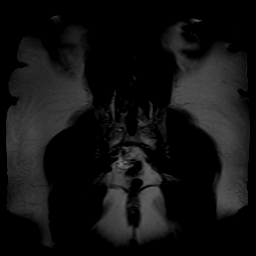

[Series 301: stir_cor-pelvis · coronal · 5.0mm · 0.70mm/px · 8 of 34 slices shown]
[im 1/34]
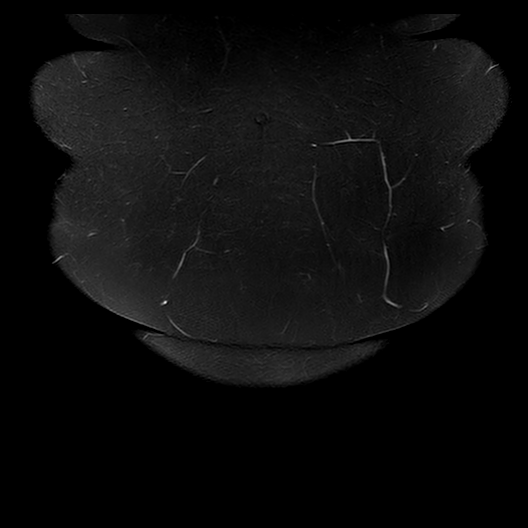
[im 5/34]
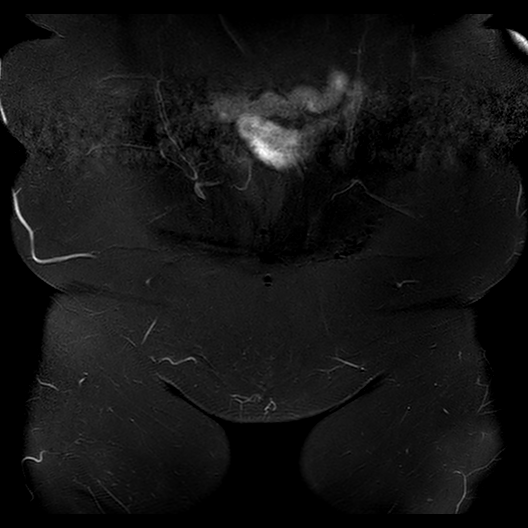
[im 10/34]
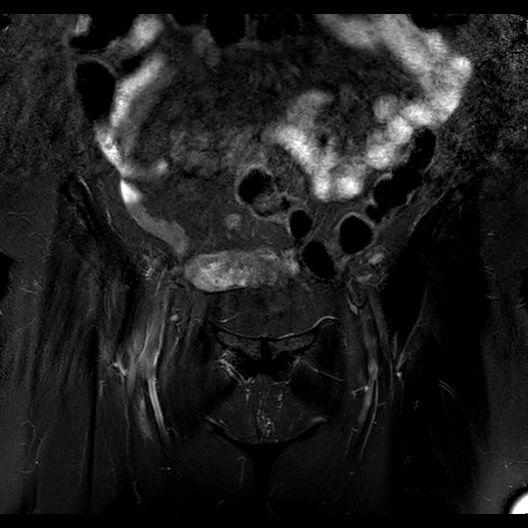
[im 15/34]
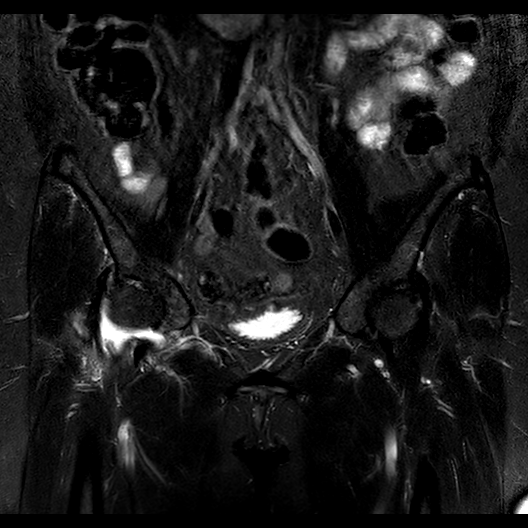
[im 19/34]
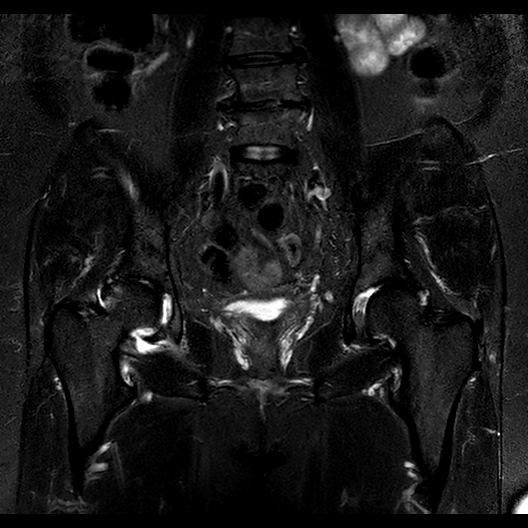
[im 24/34]
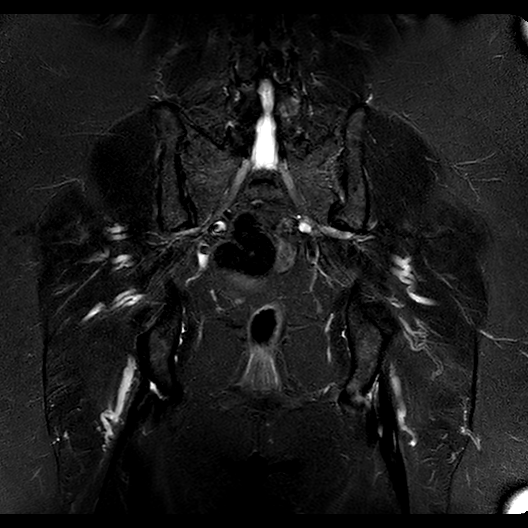
[im 29/34]
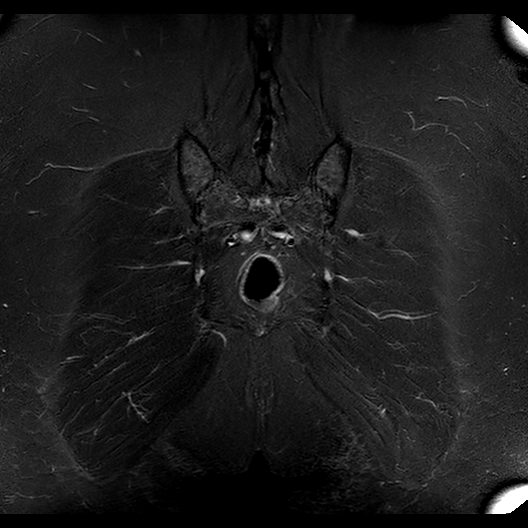
[im 34/34]
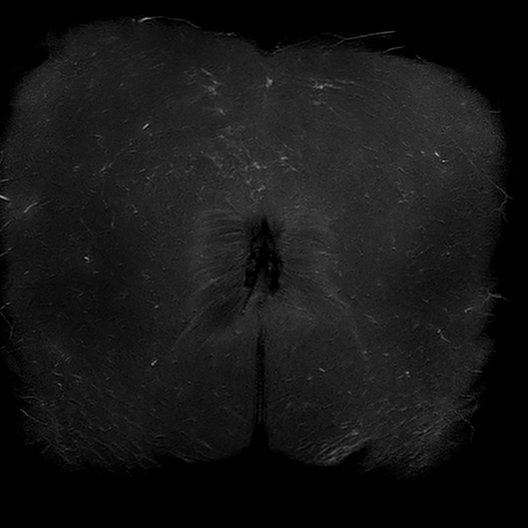

[Series 401: t1_(person_name) · axial · 5.0mm · 0.41mm/px · z∈[-140,+64]mm · 3 of 44 slices shown]
[im 5/44]
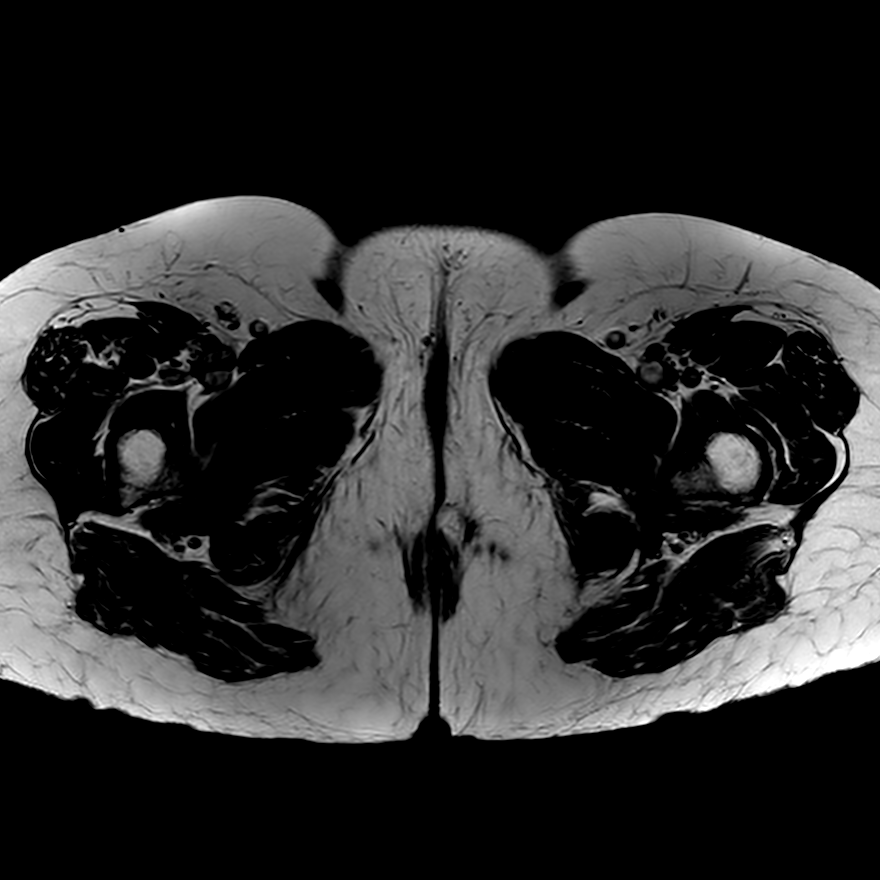
[im 24/44]
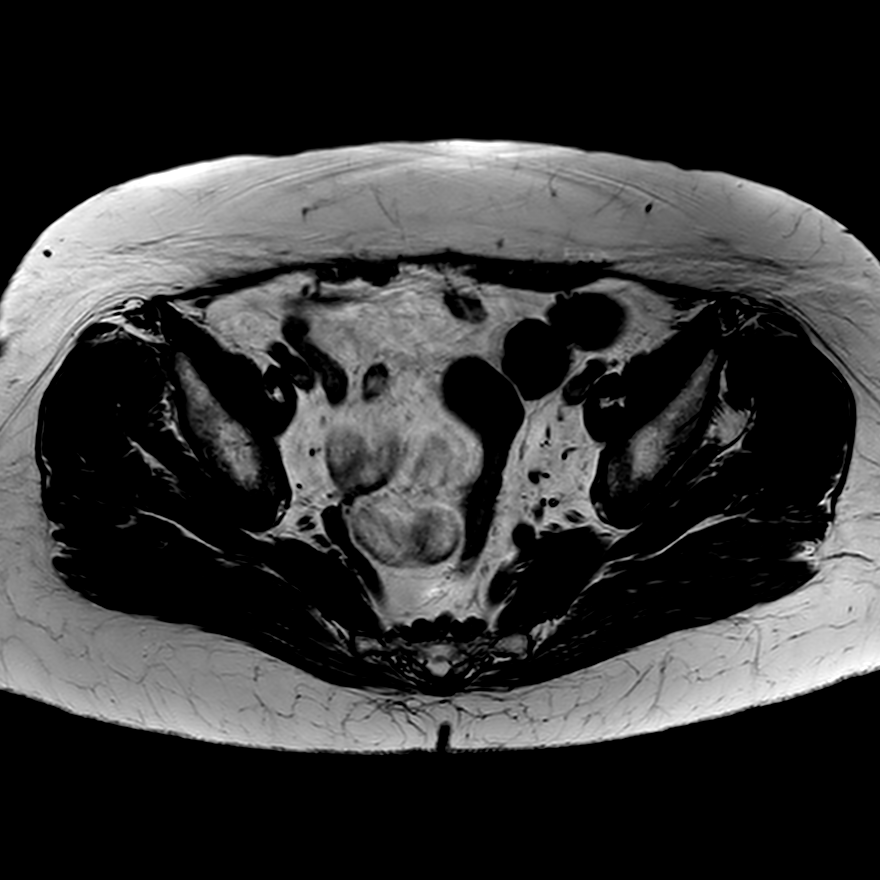
[im 39/44]
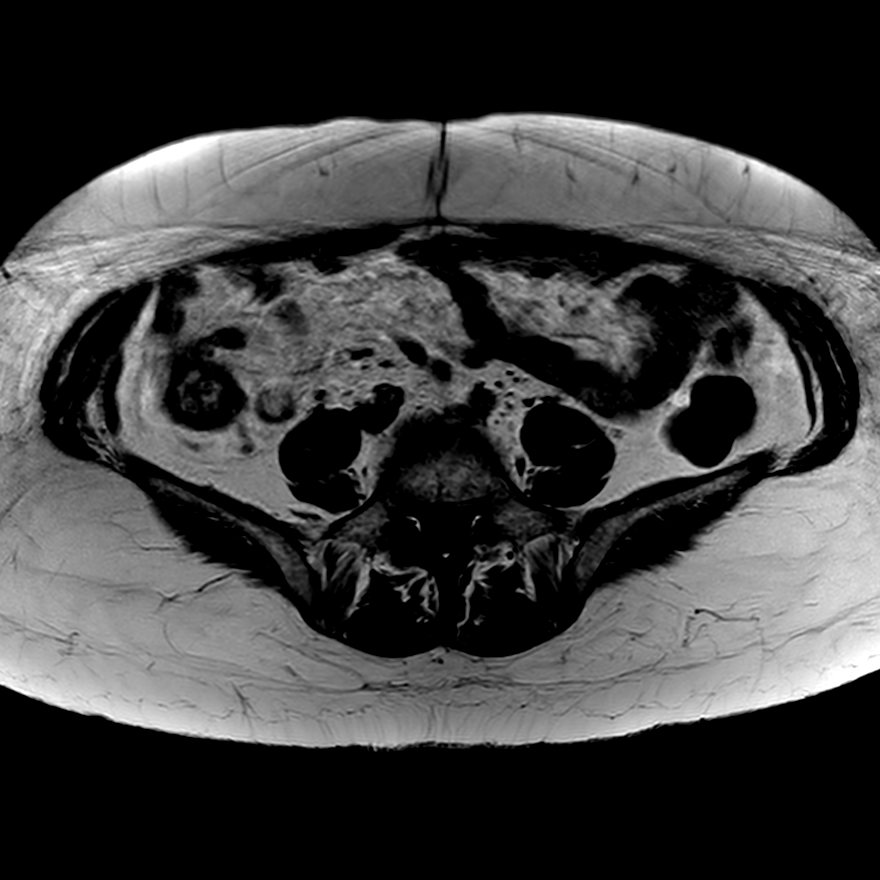

[16 of 40 positions shown; findings below may reference images not displayed]

FINDINGS: RIGHT HIP: Marked right hip degenerative change with full-thickness 
chondromalacia, small osteophytes, degenerative labral tears and 2.7 cm anterior 
paralabral cyst. Moderate joint effusion, synovitis and 1.0 cm anterior 
intra-articular body versus focal synovitis. Femoral head maintains a spherical 
configuration without evidence of avascular necrosis or subarticular collapse. 
No abnormal morphology of the proximal femur or acetabulum to predispose to 
impingement. 
LEFT HIP: Mild left hip degenerative change with partial-thickness 
chondromalacia, small osteophytes, degenerative labral tears and 0.6 cm lateral 
paralabral cyst. No hip joint effusion. Femoral head maintains a spherical 
configuration without evidence of avascular necrosis or subarticular collapse. 
No abnormal morphology of the proximal femur or acetabulum to predispose to 
impingement. 
PELVIC BONES: Normal marrow signal intensity. No fracture, contusion or marrow 
replacing lesion.  
SI JOINTS/PUBIC SYMPHYSIS: Mild degenerative change. 
SPINE: Multilevel degenerative change of the spine. 
SOFT TISSUES: Bilateral gluteus medius/minimus tendinosis with small amount of 
left peritendinous/peritrochanteric fluid. Partial thickness tears of the 
hamstring tendon origins. The rectus abdominis-adductor aponeurotic complexes 
are intact. No mass, free fluid or adenopathy. Hysterectomy and postsurgical 
change of the anterior pelvic wall. The bowel and bladder are unremarkable.
IMPRESSION: 1.  Marked right and mild left hip degenerative change, labral tears, paralabral 
cysts and moderate right joint effusions/synovitis. 
2.  Bilateral gluteus tendinosis with small amount of left 
peritendinous/peritrochanteric fluid.  
3.  Partial thickness tears of the hamstring tendon origins.  
4.  Mild degenerative change of the pubic symphysis/SI joints and multilevel 
degenerative change of the spine.  
5.  Hysterectomy.
